# Patient Record
Sex: Female | Born: 1965 | Race: Black or African American | Hispanic: No | Marital: Single | Smoking: Never smoker
Health system: Southern US, Community
[De-identification: ages and names within clinical notes are randomized; demographics above are authoritative.]

## PROBLEM LIST (undated history)

## (undated) DIAGNOSIS — M719 Bursopathy, unspecified: Secondary | ICD-10-CM

## (undated) DIAGNOSIS — F319 Bipolar disorder, unspecified: Secondary | ICD-10-CM

## (undated) DIAGNOSIS — K8020 Calculus of gallbladder without cholecystitis without obstruction: Secondary | ICD-10-CM

## (undated) DIAGNOSIS — F419 Anxiety disorder, unspecified: Secondary | ICD-10-CM

## (undated) DIAGNOSIS — D259 Leiomyoma of uterus, unspecified: Secondary | ICD-10-CM

## (undated) DIAGNOSIS — I10 Essential (primary) hypertension: Secondary | ICD-10-CM

## (undated) DIAGNOSIS — T8859XA Other complications of anesthesia, initial encounter: Secondary | ICD-10-CM

## (undated) DIAGNOSIS — K219 Gastro-esophageal reflux disease without esophagitis: Secondary | ICD-10-CM

## (undated) DIAGNOSIS — E785 Hyperlipidemia, unspecified: Secondary | ICD-10-CM

## (undated) HISTORY — DX: Anxiety disorder, unspecified: F41.9

## (undated) HISTORY — DX: Essential (primary) hypertension: I10

## (undated) HISTORY — PX: ESSURE TUBAL LIGATION: SUR464

## (undated) HISTORY — DX: Calculus of gallbladder without cholecystitis without obstruction: K80.20

## (undated) HISTORY — DX: Bursopathy, unspecified: M71.9

## (undated) HISTORY — DX: Leiomyoma of uterus, unspecified: D25.9

## (undated) HISTORY — DX: Hyperlipidemia, unspecified: E78.5

## (undated) HISTORY — DX: Gastro-esophageal reflux disease without esophagitis: K21.9

## (undated) HISTORY — DX: Bipolar disorder, unspecified: F31.9

---

## 2021-11-03 ENCOUNTER — Emergency Department: Payer: BC Managed Care – PPO

## 2021-11-03 ENCOUNTER — Emergency Department
Admission: EM | Admit: 2021-11-03 | Discharge: 2021-11-03 | Disposition: A | Discharge status: Home / Self Care | Payer: BC Managed Care – PPO | Attending: Emergency Medicine | Admitting: Emergency Medicine

## 2021-11-03 ENCOUNTER — Other Ambulatory Visit: Payer: Self-pay

## 2021-11-03 ENCOUNTER — Encounter: Payer: Self-pay | Admitting: Emergency Medicine

## 2021-11-03 DIAGNOSIS — I10 Essential (primary) hypertension: Secondary | ICD-10-CM | POA: Insufficient documentation

## 2021-11-03 DIAGNOSIS — R079 Chest pain, unspecified: Secondary | ICD-10-CM

## 2021-11-03 DIAGNOSIS — R072 Precordial pain: Secondary | ICD-10-CM | POA: Insufficient documentation

## 2021-11-03 LAB — CBC
HCT: 39 % (ref 36.0–46.0)
Hemoglobin: 12.8 g/dL (ref 12.0–15.0)
MCH: 28.6 pg (ref 26.0–34.0)
MCHC: 32.8 g/dL (ref 30.0–36.0)
MCV: 87.2 fL (ref 80.0–100.0)
Platelets: 291 10*3/uL (ref 150–400)
RBC: 4.47 MIL/uL (ref 3.87–5.11)
RDW: 13.6 % (ref 11.5–15.5)
WBC: 5.5 10*3/uL (ref 4.0–10.5)
nRBC: 0 % (ref 0.0–0.2)

## 2021-11-03 LAB — BASIC METABOLIC PANEL
Anion gap: 6 (ref 5–15)
BUN: 13 mg/dL (ref 6–20)
CO2: 28 mmol/L (ref 22–32)
Calcium: 9.4 mg/dL (ref 8.9–10.3)
Chloride: 106 mmol/L (ref 98–111)
Creatinine, Ser: 0.75 mg/dL (ref 0.44–1.00)
GFR, Estimated: >60 mL/min (ref >60)
Glucose, Bld: 94 mg/dL (ref 70–99)
Potassium: 4 mmol/L (ref 3.5–5.1)
Sodium: 140 mmol/L (ref 135–145)

## 2021-11-03 LAB — TROPONIN I (HIGH SENSITIVITY): Troponin I (High Sensitivity): <2 ng/L (ref <18)

## 2021-11-03 LAB — D-DIMER, QUANTITATIVE: D-Dimer, Quant: <0.27 ug/mL-FEU (ref 0.00–0.50)

## 2021-11-03 MED ORDER — PANTOPRAZOLE SODIUM 40 MG PO TBEC: 40.0000 mg | DELAYED_RELEASE_TABLET | Freq: Every day | ORAL | 0 refills | Status: DC

## 2021-11-03 MED ORDER — PANTOPRAZOLE SODIUM 40 MG PO TBEC
40.0000 mg | DELAYED_RELEASE_TABLET | Freq: Once | ORAL | Status: AC
  Administered 2021-11-03: 40 mg via ORAL
  Filled 2021-11-03: qty 1

## 2021-11-03 MED ORDER — AMLODIPINE BESYLATE 5 MG PO TABS: 5.0000 mg | ORAL_TABLET | Freq: Every day | ORAL | 0 refills | Status: DC

## 2021-11-03 NOTE — ED Triage Notes (Signed)
Pt via POV from [facility]. Pt c/o HTN, intermittent mid-sternal chest pressure, and dizziness for the past couple of weeks. Denies cardiac hx. Denies hx of HTN bu [place] reports a BP of 172/101. Pt is A&Ox4 and NAD.  ?

## 2021-11-03 NOTE — ED Provider Notes (Signed)
? ?[facility] ?Provider Note ? ? ? Event Date/Time  ? First MD Initiated Contact with Patient 11/03/21 1011   ?  (approximate) ? ? ?History  ? ?Chest Pain ? ? ?HPI ? ?Lorraine Perez is a 56 y.o. female with a past medical history of GERD and migraines who presents for evaluation of several concerns primarily some substernal chest pressure associate with intermittent dizziness over the last 3 to 4 weeks.  She states she has also had a couple headaches she feels actually different from usual migraine headaches most recently yesterday but none today.  She states that in the last 24 hours she had a brief episode of right-sided chest pain as well.  This resolved on its own.  She notes she is on estrogen replacement therapy for hot flashes.  No cough, shortness of breath, back pain, earache, sore throat, vision changes, fevers, abdominal pain, vomiting diarrhea or urinary symptoms.  No focal weakness numbness or tingling.  No other acute concerns at this time.  No trauma.  No history of high blood pressure.  He states he only started taking it at home over the last couple of days because she had headache at her place employment couple weeks ago noted be elevated then.  No EtOH use illicit drug use or tobacco use ?  ? ? ?Physical Exam  ?Triage Vital Signs: ?ED Triage Vitals [11/03/21 1005]  ?Enc Vitals Group  ?   BP   ?   Pulse   ?   Resp   ?   Temp   ?   Temp src   ?   SpO2   ?   Weight 160 lb (72.6 kg)  ?   Height 5\' 2"  (1.575 m)  ?   Head Circumference   ?   Peak Flow   ?   Pain Score 5  ?   Pain Loc   ?   Pain Edu?   ?   Excl. in GC?   ? ? ?Most recent vital signs: ?Vitals:  ? 11/03/21 1130 11/03/21 1200  ?BP: (!) 143/76 (!) 142/77  ?Pulse: 74 65  ?Resp: 12 17  ?Temp:    ?SpO2: 100% 100%  ? ? ?General: Awake, no distress.  ?CV:  Good peripheral perfusion.  Plus radial pulse. ?Resp:  Normal effort.  Clear bilaterally.  Right chest is unremarkable. ?Abd:  No distention.  Soft. ?Other:  Cranial  nerves II through XII grossly intact.  No pronator drift.  No finger dysmetria.  Symmetric 5/5 strength of all extremities.  Sensation intact to light touch in all extremities.   ? ? ? ?ED Results / Procedures / Treatments  ?Labs ?(all labs ordered are listed, but only abnormal results are displayed) ?Labs Reviewed  ?BASIC METABOLIC PANEL  ?CBC  ?D-DIMER, QUANTITATIVE  ?TROPONIN I (HIGH SENSITIVITY)  ? ? ? ?EKG ? ?ECG is remarkable for sinus rhythm with a ventricular rate of 68, normal axis, unremarkable intervals without evidence of acute ischemia or significant arrhythmia. ? ? ?RADIOLOGY ?Chest reviewed by myself shows no focal consoidation, effusion, edema, pneumothorax or other clear acute thoracic process. I also reviewed radiology interpretation and agree with findings described. ? ? ? ?PROCEDURES: ? ?Critical Care performed: No ? ?.1-3 Lead EKG Interpretation ?Performed by: 01/03/22, MD ?Authorized by: Gilles Chiquito, MD  ? ?  Interpretation: normal   ?  ECG rate assessment: normal   ?  Rhythm: sinus rhythm   ?  Ectopy:  none   ?  Conduction: normal   ? ?The patient is on the cardiac monitor to evaluate for evidence of arrhythmia and/or significant heart rate changes. ? ? ?MEDICATIONS ORDERED IN ED: ?Medications  ?pantoprazole (PROTONIX) EC tablet 40 mg (has no administration in time range)  ? ? ? ?IMPRESSION / MDM / ASSESSMENT AND PLAN / ED COURSE  ?I reviewed the triage vital signs and the nursing notes. ?             ?               ? ?Differential diagnosis includes, but is not limited to ACS, PE, pneumonia, pneumothorax, GERD, esophageal spasm, arrhythmia, anemia and metabolic derangements with a lower suspicion based description as pressure-like on and off over weeks for or dissection. ? ?Prior to her headache she describes it as a frontal pressure ongoing for several weeks.  Overall description is not suggestive of an SAH.  He has a nonfocal neurological exam that is not suggestive of CVA and  there is no evidence of acute infectious process or trauma at this time.  Possibly related symptomatic hypertension or metabolic derangements versus primary headache. ? ?ECG is remarkable for sinus rhythm with a ventricular rate of 68, normal axis, unremarkable intervals without evidence of acute ischemia or significant arrhythmia.  Given undetectable troponin obtained greater than 3 hours after symptom onset if a very low suspicion for ACS myocarditis. ? ?Chest reviewed by myself shows no focal consoidation, effusion, edema, pneumothorax or other clear acute thoracic process. I also reviewed radiology interpretation and agree with findings described. ? ?Given undetectable D-dimer low suspicion for PE.  CBC without leukocytosis or acute anemia.  BMP without any significant electrolyte or metabolic derangements. ? ?Patient still mildly hypertensive on several reassessments.  We will start on low-dose amlodipine.  Is also certainly possible her substernal pressure is related to GI etiology and will trial a course of Protonix.  Discussed importance of close outpatient PCP follow-up.  Emphasized return for any new or worsening symptoms.  Discharged in stable condition.  Strict return precautions advised and discussed. ? ?  ? ? ?FINAL CLINICAL IMPRESSION(S) / ED DIAGNOSES  ? ?Final diagnoses:  ?Chest pain, unspecified type  ?Hypertension, unspecified type  ? ? ? ?Rx / DC Orders  ? ?ED Discharge Orders   ? ?      Ordered  ?  amLODipine (NORVASC) 5 MG tablet  Daily       ? 11/03/21 1212  ?  pantoprazole (PROTONIX) 40 MG tablet  Daily       ? 11/03/21 1212  ? ?  ?  ? ?  ? ? ? ?Note:  This document was prepared using Dragon voice recognition software and may include unintentional dictation errors. ?  ?Gilles Chiquito, MD ?11/03/21 1213 ? ?

## 2021-12-31 ENCOUNTER — Encounter: Payer: BC Managed Care – PPO | Admitting: Obstetrics and Gynecology

## 2023-01-15 ENCOUNTER — Other Ambulatory Visit: Payer: Self-pay | Admitting: Obstetrics and Gynecology

## 2023-01-15 DIAGNOSIS — Z1231 Encounter for screening mammogram for malignant neoplasm of breast: Secondary | ICD-10-CM

## 2023-11-29 ENCOUNTER — Emergency Department

## 2023-11-29 ENCOUNTER — Emergency Department
Admission: EM | Admit: 2023-11-29 | Discharge: 2023-11-29 | Disposition: A | Discharge status: Home / Self Care | Attending: Emergency Medicine | Admitting: Emergency Medicine

## 2023-11-29 ENCOUNTER — Other Ambulatory Visit: Payer: Self-pay

## 2023-11-29 DIAGNOSIS — K8070 Calculus of gallbladder and bile duct without cholecystitis without obstruction: Secondary | ICD-10-CM | POA: Insufficient documentation

## 2023-11-29 DIAGNOSIS — K8020 Calculus of gallbladder without cholecystitis without obstruction: Secondary | ICD-10-CM

## 2023-11-29 DIAGNOSIS — R109 Unspecified abdominal pain: Secondary | ICD-10-CM | POA: Diagnosis present

## 2023-11-29 DIAGNOSIS — K8050 Calculus of bile duct without cholangitis or cholecystitis without obstruction: Secondary | ICD-10-CM

## 2023-11-29 LAB — CHLAMYDIA/NGC RT PCR (ARMC ONLY)
Chlamydia Tr: NOT DETECTED
N gonorrhoeae: NOT DETECTED

## 2023-11-29 LAB — COMPREHENSIVE METABOLIC PANEL WITH GFR
ALT: 11 U/L (ref 0–44)
AST: 13 U/L — ABNORMAL LOW (ref 15–41)
Albumin: 4.1 g/dL (ref 3.5–5.0)
Alkaline Phosphatase: 53 U/L (ref 38–126)
Anion gap: 10 (ref 5–15)
BUN: 13 mg/dL (ref 6–20)
CO2: 20 mmol/L — ABNORMAL LOW (ref 22–32)
Calcium: 9.2 mg/dL (ref 8.9–10.3)
Chloride: 107 mmol/L (ref 98–111)
Creatinine, Ser: 0.67 mg/dL (ref 0.44–1.00)
GFR, Estimated: >60 mL/min (ref >60)
Glucose, Bld: 97 mg/dL (ref 70–99)
Potassium: 4 mmol/L (ref 3.5–5.1)
Sodium: 137 mmol/L (ref 135–145)
Total Bilirubin: 1.2 mg/dL (ref 0.0–1.2)
Total Protein: 6.7 g/dL (ref 6.5–8.1)

## 2023-11-29 LAB — CBC
HCT: 36.3 % (ref 36.0–46.0)
Hemoglobin: 11.7 g/dL — ABNORMAL LOW (ref 12.0–15.0)
MCH: 28.6 pg (ref 26.0–34.0)
MCHC: 32.2 g/dL (ref 30.0–36.0)
MCV: 88.8 fL (ref 80.0–100.0)
Platelets: 235 10*3/uL (ref 150–400)
RBC: 4.09 MIL/uL (ref 3.87–5.11)
RDW: 14.1 % (ref 11.5–15.5)
WBC: 5.6 10*3/uL (ref 4.0–10.5)
nRBC: 0 % (ref 0.0–0.2)

## 2023-11-29 LAB — WET PREP, GENITAL
Clue Cells Wet Prep HPF POC: NONE SEEN
Sperm: NONE SEEN
Trich, Wet Prep: NONE SEEN
WBC, Wet Prep HPF POC: <10 (ref <10)
Yeast Wet Prep HPF POC: NONE SEEN

## 2023-11-29 LAB — LIPASE, BLOOD: Lipase: 31 U/L (ref 11–51)

## 2023-11-29 MED ORDER — LACTATED RINGERS IV BOLUS
1000.0000 mL | Freq: Once | INTRAVENOUS | Status: AC
  Administered 2023-11-29: 1000 mL via INTRAVENOUS

## 2023-11-29 MED ORDER — OXYCODONE HCL 5 MG PO TABS: 5.0000 mg | ORAL_TABLET | Freq: Three times a day (TID) | ORAL | 0 refills | Status: DC | PRN

## 2023-11-29 MED ORDER — IOHEXOL 300 MG/ML  SOLN
100.0000 mL | Freq: Once | INTRAMUSCULAR | Status: AC | PRN
  Administered 2023-11-29: 100 mL via INTRAVENOUS

## 2023-11-29 MED ORDER — ONDANSETRON 4 MG PO TBDP: 4.0000 mg | ORAL_TABLET | Freq: Three times a day (TID) | ORAL | 0 refills | Status: DC | PRN

## 2023-11-29 MED ORDER — KETOROLAC TROMETHAMINE 30 MG/ML IJ SOLN
15.0000 mg | Freq: Once | INTRAMUSCULAR | Status: AC
  Administered 2023-11-29: 15 mg via INTRAVENOUS
  Filled 2023-11-29: qty 1

## 2023-11-29 NOTE — ED Notes (Signed)
 Warm blanket provided by this RN

## 2023-11-29 NOTE — ED Provider Notes (Signed)
 [facility] Provider Note    Event Date/Time   First MD Initiated Contact with Patient 11/29/23 1051     (approximate)   History   Abdominal Pain   HPI  Lorraine Perez is a 58 y.o. female who presents to the ED for evaluation of Abdominal Pain   I reviewed 5/2 [facility] ED visit, seen for acute abdominal pain, CT with uterine masses suggestive of fibroids, possibly small bowel loops tethered to the anterior uterus.Aaron Aas  CMP with T. bili 1.4, LFTs around 80.  Subsequent 5/5 PCP follow-up.  Patient presents to the ED for evaluation of intermittent abdominal pain over the past few months.  Reports episodic pain that lasts up to an hour at a time, a few times per week, sometimes associated with emesis.   No postprandial symptoms or worsening.  No emesis outside of these episodic pain spells.  No stool or urinary changes.  No fevers, syncope.  She does report for the past few days that she has had different vaginal discharge but no vaginal bleeding, fever   Physical Exam   Triage Vital Signs: ED Triage Vitals  Encounter Vitals Group     BP 11/29/23 1044 124/72     Systolic BP Percentile --      Diastolic BP Percentile --      Pulse Rate 11/29/23 1044 79     Resp 11/29/23 1044 17     Temp 11/29/23 1044 98.7 F (37.1 C)     Temp Source 11/29/23 1044 Oral     SpO2 11/29/23 1044 98 %     Weight 11/29/23 1045 143 lb (64.9 kg)     Height 11/29/23 1045 5\' 2"  (1.575 m)     Head Circumference --      Peak Flow --      Pain Score 11/29/23 1045 7     Pain Loc --      Pain Education --      Exclude from Growth Chart --     Most recent vital signs: Vitals:   11/29/23 1430 11/29/23 1500  BP: (!) 140/75 (!) 134/59  Pulse: 78 61  Resp:    Temp:    SpO2: 96% 100%    General: Awake, no distress.  CV:  Good peripheral perfusion.  Resp:  Normal effort.  Abd:  No distention.  Epigastric mild tenderness, benign lower abdomen.  No peritoneal features or guarding  throughout MSK:  No deformity noted.  Neuro:  No focal deficits appreciated. Other:     ED Results / Procedures / Treatments   Labs (all labs ordered are listed, but only abnormal results are displayed) Labs Reviewed  COMPREHENSIVE METABOLIC PANEL WITH GFR - Abnormal; Notable for the following components:      Result Value   CO2 20 (*)    AST 13 (*)    All other components within normal limits  CBC - Abnormal; Notable for the following components:   Hemoglobin 11.7 (*)    All other components within normal limits  CHLAMYDIA/NGC RT PCR ([facility] ONLY)            WET PREP, GENITAL  LIPASE, BLOOD  URINALYSIS, ROUTINE W REFLEX MICROSCOPIC  POC URINE PREG, ED    EKG   RADIOLOGY RUQ ultrasound interpreted by me with gallstones without clear signs of acute cholecystitis CT abdomen/pelvis interpreted by me with slightly enlarged gallbladder without clear stones  Official radiology report(s): US  ABDOMEN LIMITED RUQ (LIVER/GB) Result Date: 11/29/2023 CLINICAL  DATA:  [id number] Emesis [id number] Abdominal pain [id number] EXAM: ULTRASOUND ABDOMEN LIMITED RIGHT UPPER QUADRANT COMPARISON:  Same day CT. FINDINGS: Gallbladder: Cholelithiasis. Mild wall prominence measuring up to approximately 3 mm. No sonographic Murphy sign noted by sonographer. Common bile duct: Diameter: Visualized portion measures 8 mm, dilated. Liver: No focal lesion identified. Mildly heterogeneous in parenchymal echogenicity. Portal vein is patent on color Doppler imaging with normal direction of blood flow towards the liver. Other: None. IMPRESSION: 1. Cholelithiasis with mild gallbladder wall prominence. No sonographic Murphy sign noted by sonographer. Findings are equivocal for acute cholecystitis and may reflect chronic cholecystitis. If persistent clinical concern, recommend further evaluation with HIDA scan (with ejection fraction calculation if concern for chronic biliary dyskinesia). 2. Common bile duct is dilated measuring up  to 8 mm. Recommend correlation with LFTs. If there is concern for biliary obstruction, recommend further evaluation with MRCP. Electronically Signed   By: Clancy Crimes M.D.   On: 11/29/2023 14:08   CT ABDOMEN PELVIS W CONTRAST Result Date: 11/29/2023 CLINICAL DATA:  intermittent abdominal pain, emesis. acute upper abd pain today, mild TTP EXAM: CT ABDOMEN AND PELVIS WITH CONTRAST TECHNIQUE: Multidetector CT imaging of the abdomen and pelvis was performed using the standard protocol following bolus administration of intravenous contrast. RADIATION DOSE REDUCTION: This exam was performed according to the departmental dose-optimization program which includes automated exposure control, adjustment of the mA and/or kV according to patient size and/or use of iterative reconstruction technique. CONTRAST:  100mL OMNIPAQUE IOHEXOL 300 MG/ML  SOLN COMPARISON:  None Available. FINDINGS: Lower chest: Bibasilar atelectasis. Hepatobiliary: There is mild circumferential gallbladder wall thickening without adjacent fat stranding. Common bile duct is mildly enlarged and measures approximately 8 mm. No choledocholithiasis or visualized. Mild focal fatty deposition along the falciform ligament. Pancreas: Unremarkable. No pancreatic ductal dilatation or surrounding inflammatory changes. Spleen: Normal in size without focal abnormality. Adrenals/Urinary Tract: Adrenal glands are unremarkable. Kidneys enhance symmetrically. No hydronephrosis. No obstructing nephrolithiasis. Bladder is unremarkable. Stomach/Bowel: No evidence of bowel obstruction. Appendix is normal. Stomach is decompressed, limiting evaluation. Vascular/Lymphatic: No significant vascular findings are present. No enlarged abdominal or pelvic lymph nodes. Reproductive: Innumerable enhancing masses of the uterus which distort the endometrial canal. Some are exophytic in appearance and some are peripherally calcified. These are most consistent with innumerable  fibroids. Prominent submucosal enhancement of the vagina with wall prominence. Other: No free air or free fluid. Musculoskeletal: No acute or significant osseous findings. IMPRESSION: 1. There is mild circumferential gallbladder wall thickening without adjacent fat stranding. This is nonspecific it could be seen in the setting of chronic cholecystitis. If concern for biliary dyskinesia, this would be better assessed with dedicated HIDA scan with ejection fraction calculation. 2. Common bile duct is mildly enlarged measuring approximately 8 mm. No choledocholithiasis or visualized. Recommend correlation with LFTs. If there is concern for biliary obstruction, recommend further evaluation with MRCP. 3. Fibroid uterus. 4. Prominent submucosal enhancement of the vagina with wall prominence. This is nonspecific and could reflect vaginitis versus sequela of pelvic congestion. Recommend correlation with physical exam and history. Electronically Signed   By: Clancy Crimes M.D.   On: 11/29/2023 12:36    PROCEDURES and INTERVENTIONS:  Procedures  Medications  lactated ringers bolus 1,000 mL (1,000 mLs Intravenous New Bag/Given 11/29/23 1139)  ketorolac (TORADOL) 30 MG/ML injection 15 mg (15 mg Intravenous Given 11/29/23 1139)  iohexol (OMNIPAQUE) 300 MG/ML solution 100 mL (100 mLs Intravenous Contrast Given 11/29/23 1152)     IMPRESSION /  MDM / ASSESSMENT AND PLAN / ED COURSE  I reviewed the triage vital signs and the nursing notes.  Differential diagnosis includes, but is not limited to, SBO, IBS, functional pain, uterine fibroids, PID  {Patient presents with symptoms of an acute illness or injury that is potentially life-threatening.  58 year old woman presents to the ED with signs of symptomatic with area colic suitable for outpatient management with surgical follow-up.  Pain resolved with Toradol, minimal localized tenderness without guarding or peritoneal features.  Normal LFTs without signs of  hepatobiliary obstruction.  Normal white count, lipase.  Wet prep without abnormal features considering her mild complaints of abnormal vaginal discharge.  No signs of choledocholithiasis or cholecystitis clinically.  I do believe she is suitable for outpatient management.  Clinical Course as of 11/29/23 1535  Sat Nov 29, 2023  1256 Reassessed and reexamined.  Discussed workup so far and how compared to the workup at [facility] few weeks ago.  Discussed CT results and my recommendation for an RUQ ultrasound.  She is agreeable.  Reports feeling fine right now [DS]    Clinical Course User Index [DS] Arline Bennett, MD     FINAL CLINICAL IMPRESSION(S) / ED DIAGNOSES   Final diagnoses:  Biliary colic  Calculus of gallbladder without cholecystitis without obstruction     Rx / DC Orders   ED Discharge Orders          Ordered    oxyCODONE (ROXICODONE) 5 MG immediate release tablet  Every 8 hours PRN        11/29/23 1534    ondansetron (ZOFRAN-ODT) 4 MG disintegrating tablet  Every 8 hours PRN        11/29/23 1534             Note:  This document was prepared using Dragon voice recognition software and may include unintentional dictation errors.   Arline Bennett, MD 11/29/23 1537

## 2023-11-29 NOTE — ED Triage Notes (Signed)
 Pt comes in via pov with complains of abdominal pain. Pt states that she was seen at [facility] 2 weeks ago, and she was told that her small bowel is attached to her uterus. Pt was supposed to follow up with her primary care, who is not available at this time. Pt was prescribed oxycodone for pain, but pt hasn't had any relief. Pt took some tylenol about 2 hours ago, and now rates her pain 7/10. Pt is alert and oriented x4 with no signs of acute distress at this time.

## 2023-11-29 NOTE — Discharge Instructions (Signed)
 As we discussed, signs of gallstones likely causing your symptoms.  Reach out to the surgeon to be seen in the clinic to discuss removal of your gallbladder and next steps  In the meantime, use the medications to help with nausea and pain.  Please take Tylenol and ibuprofen/Advil for your pain.  It is safe to take them together, or to alternate them every few hours.  Take up to 1000mg  of Tylenol at a time, up to 4 times per day.  Do not take more than 4000 mg of Tylenol in 24 hours.  For ibuprofen, take 400-600 mg, 3 - 4 times per day.

## 2023-12-15 ENCOUNTER — Ambulatory Visit: Payer: Self-pay | Admitting: Surgery

## 2023-12-15 ENCOUNTER — Encounter: Payer: Self-pay | Admitting: Surgery

## 2023-12-15 VITALS — BP 149/93 | HR 60 | Temp 98.3°F | Ht 62.0 in | Wt 148.0 lb

## 2023-12-15 DIAGNOSIS — K8020 Calculus of gallbladder without cholecystitis without obstruction: Secondary | ICD-10-CM | POA: Diagnosis not present

## 2023-12-15 NOTE — H&P (View-Only) (Signed)
 12/15/2023  Reason for Visit: Symptomatic cholelithiasis  History of Present Illness: Ylianna Osterman is a 58 y.o. female presenting for evaluation of symptomatic cholelithiasis.  The patient reports having intermittent episodes of epigastric and RUQ pain over the past two months.  She has had a handful of episodes thus far.  She went to [facility] on 10/31/23 and had a CT scan of abdomen which did not show any acute findings.  She also went to the ED at [facility] on 11/29/23 and had both CT scan and U/S, the latter which showed cholelithiasis and borderline wall thickness of 3 mm and CBD of 8 mm.  She reports that with the pain episodes she's had severe nausea and vomiting, and has had residual epigastric soreness after the episodes.  Denies any fevers and chills.  She reports the episodes lasted a couple of hours each.  All of them happened at night time.    Past Medical History: Past Medical History:  Diagnosis Date   Anxiety    Bipolar disorder (HCC)    Bursitis    Cholelithiasis    Fibroid uterus    GERD (gastroesophageal reflux disease)    Hyperlipidemia    Hypertension      Past Surgical History: Past Surgical History:  Procedure Laterality Date   ESSURE TUBAL LIGATION      Home Medications: Prior to Admission medications   Medication Sig Start Date End Date Taking? Authorizing Provider  amLODipine  (NORVASC ) 5 MG tablet Take 1 tablet (5 mg total) by mouth daily. 11/03/21 12/15/23 Yes Renie Carver, MD  CLIMARA PRO 0.045-0.015 MG/DAY 1 patch once a week.   Yes [provider]  FLUoxetine (PROZAC) 10 MG capsule Take 10 mg by mouth daily.   Yes [provider]  Galcanezumab-gnlm (EMGALITY) 120 MG/ML SOAJ Inject the contents of 1 pen (120 mg) into the skin every 30 days. 10/02/20  Yes [provider]  lamoTRIgine (LAMICTAL) 200 MG tablet Take 200 mg by mouth daily.   Yes [provider]  lithium carbonate (LITHOBID) 300 MG ER tablet Take 300 mg  by mouth 2 (two) times daily.   Yes [provider]  ondansetron  (ZOFRAN -ODT) 4 MG disintegrating tablet Take 1 tablet (4 mg total) by mouth every 8 (eight) hours as needed. 11/29/23  Yes Arline Bennett, MD  oxyCODONE  (ROXICODONE ) 5 MG immediate release tablet Take 1 tablet (5 mg total) by mouth every 8 (eight) hours as needed. 11/29/23 11/28/24 Yes Arline Bennett, MD  pantoprazole  (PROTONIX ) 40 MG tablet Take 1 tablet (40 mg total) by mouth daily for 14 days. 11/03/21 12/15/23 Yes Renie Carver, MD  TRAZODONE HCL ER PO Take 75 mg by mouth.   Yes [provider]    Allergies: No Known Allergies  Social History:  reports that she has never smoked. She has never used smokeless tobacco. No history on file for alcohol use and drug use.   Family History: History reviewed. No pertinent family history.  Review of Systems: Review of Systems  Constitutional:  Negative for chills and fever.  HENT:  Negative for hearing loss.   Respiratory:  Negative for shortness of breath.   Cardiovascular:  Negative for chest pain.  Gastrointestinal:  Positive for abdominal pain, nausea and vomiting.  Genitourinary:  Negative for dysuria.  Musculoskeletal:  Positive for back pain. Negative for myalgias.  Skin:  Negative for rash.  Neurological:  Negative for dizziness.  Psychiatric/Behavioral:  Negative for depression.     Physical Exam  BP (!) 149/93   Pulse 60   Temp 98.3 F (36.8 C) (Oral)   Ht 5' 2 (1.575 m)   Wt 148 lb (67.1 kg)   SpO2 100%   BMI 27.07 kg/m  CONSTITUTIONAL: No acute distress HEENT:  Normocephalic, atraumatic, extraocular motion intact. NECK: Trachea is midline, and there is no jugular venous distension.  RESPIRATORY:  Lungs are clear, and breath sounds are equal bilaterally. Normal respiratory effort without pathologic use of accessory muscles. CARDIOVASCULAR: Heart is regular without murmurs, gallops, or rubs. GI: The abdomen is soft, non-distended, with  discomfort in epigastric and right upper quadrant areas.  Negative Murphy's sign.  MUSCULOSKELETAL:  Normal muscle strength and tone in all four extremities.  No peripheral edema or cyanosis. SKIN: Skin turgor is normal. There are no pathologic skin lesions.  NEUROLOGIC:  Motor and sensation is grossly normal.  Cranial nerves are grossly intact. PSYCH:  Alert and oriented to person, place and time. Affect is normal.  Laboratory Analysis: Labs from 11/29/23: Na 137, K 4, Cl 107, CO2 20, BUN 13, Cr 0.67.  Total bili 1.2, AST 13, ALT 11, Alk Phos 53, albumin 4.1, lipase 31.  WBC 5.6, Hgb 11.7, Hct 36.3, Plt 235.  Imaging: CT abdomen/pelvis on 11/29/23: IMPRESSION: 1. There is mild circumferential gallbladder wall thickening without adjacent fat stranding. This is nonspecific it could be seen in the setting of chronic cholecystitis. If concern for biliary dyskinesia, this would be better assessed with dedicated HIDA scan with ejection fraction calculation. 2. Common bile duct is mildly enlarged measuring approximately 8 mm. No choledocholithiasis or visualized. Recommend correlation with LFTs. If there is concern for biliary obstruction, recommend further evaluation with MRCP. 3. Fibroid uterus. 4. Prominent submucosal enhancement of the vagina with wall prominence. This is nonspecific and could reflect vaginitis versus sequela of pelvic congestion. Recommend correlation with physical exam and history.  Ultrasound RUQ on 11/29/23: IMPRESSION: 1. Cholelithiasis with mild gallbladder wall prominence. No sonographic Murphy sign noted by sonographer. Findings are equivocal for acute cholecystitis and may reflect chronic cholecystitis. If persistent clinical concern, recommend further evaluation with HIDA scan (with ejection fraction calculation if concern for chronic biliary dyskinesia). 2. Common bile duct is dilated measuring up to 8 mm. Recommend correlation with LFTs. If there is concern for biliary  obstruction, recommend further evaluation with MRCP.  Assessment and Plan: This is a 58 y.o. female with symptomatic cholelithiasis  --Discussed with the patient the findings on her imaging studies.  On her CT scan at [facility], no cholelithiasis was identified, but I discussed with the patient that gallstones are not usually seen on CT scan and ultrasound is the better study to evaluate the gallbladder.  On her ED visit at [facility], there was some mild wall thickening seen on her CT scan and ultrasound, and her ultrasound also showed cholelithiasis.  Given the location of her pain, and associated symptoms, I think her gallbladder is the likely etiology.  There is no evidence of any bowel dilation or obstruction.  There is stool burden in her colon and she does have constipation issues.  I recommended that she start taking MiraLax.   --From the gallbladder standpoint, discussed options for conservative management with low fat diet vs surgical management with cholecystectomy.  She has opted for surgery.  Reviewed then with her the plan for a robotic assisted cholecystectomy and discussed with her the surgery at length including the planned incisions, the risks of bleeding, infection, injury to surrounding structures, the use  of ICG to better evaluate the biliary anatomy, that this would be an outpatient surgery, post-operative activity restrictions, pain control, and she's willing to proceed. --Will schedule her for surgery on 12/25/23.  All of her questions have been answered.  I spent 60 minutes dedicated to the care of this patient on the date of this encounter to include pre-visit review of records, face-to-face time with the patient discussing diagnosis and management, and any post-visit coordination of care.   Marene Shape, MD [facility]

## 2023-12-15 NOTE — Patient Instructions (Signed)
 You have requested to have your gallbladder removed. This will be done at [facility] with Dr. Aleen Campi.  If you are on any injectable weight loss medication, you will need to stop taking your GLP-1 injectable (weight loss) medications 8 days before your surgery to avoid any complications with anesthesia.   You will most likely be out of work 1-2 weeks for this surgery.  If you have FMLA or disability paperwork that needs filled out you may drop this off at our office or this can be faxed to [phone].  You will return after your post-op appointment with a lifting restriction for approximately 4 more weeks.  You will be able to eat anything you would like to following surgery. But, start by eating a bland diet and advance this as tolerated. The Gallbladder diet is below, please go as closely by this diet as possible prior to surgery to avoid any further attacks.  Please see the (blue)pre-care form that you have been given today. Our surgery scheduler will call you to verify surgery date and to go over information.   If you have any questions, please call our office.  Laparoscopic Cholecystectomy Laparoscopic cholecystectomy is surgery to remove the gallbladder. The gallbladder is located in the upper right part of the abdomen, behind the liver. It is a storage sac for bile, which is produced in the liver. Bile aids in the digestion and absorption of fats. Cholecystectomy is often done for inflammation of the gallbladder (cholecystitis). This condition is usually caused by a buildup of gallstones (cholelithiasis) in the gallbladder. Gallstones can block the flow of bile, and that can result in inflammation and pain. In severe cases, emergency surgery may be required. If emergency surgery is not required, you will have time to prepare for the procedure. Laparoscopic surgery is an alternative to open surgery. Laparoscopic surgery has a shorter recovery time. Your common bile duct may also  need to be examined during the procedure. If stones are found in the common bile duct, they may be removed. LET [facility] CARE PROVIDER KNOW ABOUT: Any allergies you have. All medicines you are taking, including vitamins, herbs, eye drops, creams, and over-the-counter medicines. Previous problems you or members of your family have had with the use of anesthetics. Any blood disorders you have. Previous surgeries you have had.  Any medical conditions you have. RISKS AND COMPLICATIONS Generally, this is a safe procedure. However, problems may occur, including: Infection. Bleeding. Allergic reactions to medicines. Damage to other structures or organs. A stone remaining in the common bile duct. A bile leak from the cyst duct that is clipped when your gallbladder is removed. The need to convert to open surgery, which requires a larger incision in the abdomen. This may be necessary if your surgeon thinks that it is not safe to continue with a laparoscopic procedure. BEFORE THE PROCEDURE Ask your health care provider about: Changing or stopping your regular medicines. This is especially important if you are taking diabetes medicines or blood thinners. Taking medicines such as aspirin and ibuprofen. These medicines can thin your blood. Do not take these medicines before your procedure if your health care provider instructs you not to. Follow instructions from your health care provider about eating or drinking restrictions. Let your health care provider know if you develop a cold or an infection before surgery. Plan to have someone take you home after the procedure. Ask your health care provider how your surgical site will be marked or identified. You may  be given antibiotic medicine to help prevent infection. PROCEDURE To reduce your risk of infection: Your health care team will wash or sanitize their hands. Your skin will be washed with soap. An IV tube may be inserted into one of your  veins. You will be given a medicine to make you fall asleep (general anesthetic). A breathing tube will be placed in your mouth. The surgeon will make several small cuts (incisions) in your abdomen. A thin, lighted tube (laparoscope) that has a tiny camera on the end will be inserted through one of the small incisions. The camera on the laparoscope will send a picture to a TV screen (monitor) in the operating room. This will give the surgeon a good view inside your abdomen. A gas will be pumped into your abdomen. This will expand your abdomen to give the surgeon more room to perform the surgery. Other tools that are needed for the procedure will be inserted through the other incisions. The gallbladder will be removed through one of the incisions. After your gallbladder has been removed, the incisions will be closed with stitches (sutures), staples, or skin glue. Your incisions may be covered with a bandage (dressing). The procedure may vary among health care providers and hospitals. AFTER THE PROCEDURE Your blood pressure, heart rate, breathing rate, and blood oxygen level will be monitored often until the medicines you were given have worn off. You will be given medicines as needed to control your pain.   This information is not intended to replace advice given to you by your health care provider. Make sure you discuss any questions you have with your health care provider.   Document Released: 06/17/2005 Document Revised: 03/08/2015 Document Reviewed: 01/27/2013 Elsevier Interactive Patient Education 2016 [employer] Inc[employer]   Low-Fat Diet for Gallbladder Conditions A low-fat diet can be helpful if you have pancreatitis or a gallbladder condition. With these conditions, your pancreas and gallbladder have trouble digesting fats. A healthy eating plan with less fat will help rest your pancreas and gallbladder and reduce your symptoms. WHAT DO I NEED TO KNOW ABOUT THIS DIET? Eat a low-fat  diet. Reduce your fat intake to less than 20-30% of your total daily calories. This is less than 50-60 g of fat per day. Remember that you need some fat in your diet. Ask your dietician what your daily goal should be. Choose nonfat and low-fat healthy foods. Look for the words "nonfat," "low fat," or "fat free." As a guide, look on the label and choose foods with less than 3 g of fat per serving. Eat only one serving. Avoid alcohol. Do not smoke. If you need help quitting, talk with your health care provider. Eat small frequent meals instead of three large heavy meals. WHAT FOODS CAN I EAT? Grains Include healthy grains and starches such as potatoes, wheat bread, fiber-rich cereal, and brown rice. Choose whole grain options whenever possible. In adults, whole grains should account for 45-65% of your daily calories.  Fruits and Vegetables Eat plenty of fruits and vegetables. Fresh fruits and vegetables add fiber to your diet. Meats and Other Protein Sources Eat lean meat such as chicken and pork. Trim any fat off of meat before cooking it. Eggs, fish, and beans are other sources of protein. In adults, these foods should account for 10-35% of your daily calories. Dairy Choose low-fat milk and dairy options. Dairy includes fat and protein, as well as calcium.  Fats and Oils Limit high-fat foods such as fried foods, sweets, baked  goods, sugary drinks.  Other Creamy sauces and condiments, such as mayonnaise, can add extra fat. Think about whether or not you need to use them, or use smaller amounts or low fat options. WHAT FOODS ARE NOT RECOMMENDED? High fat foods, such as: [employer]. Ice cream. [place] toast. Sweet rolls. Pizza. Cheese bread. Foods covered with batter, butter, creamy sauces, or cheese. Fried foods. Sugary drinks and desserts. Foods that cause gas or bloating   This information is not intended to replace advice given to you by your health care provider. Make sure you  discuss any questions you have with your health care provider.   Document Released: 06/22/2013 Document Reviewed: 06/22/2013 Elsevier Interactive Patient Education [employer]

## 2023-12-15 NOTE — Progress Notes (Signed)
 12/15/2023  Reason for Visit: Symptomatic cholelithiasis  History of Present Illness: Lorraine Perez is a 58 y.o. female presenting for evaluation of symptomatic cholelithiasis.  The patient reports having intermittent episodes of epigastric and RUQ pain over the past two months.  She has had a handful of episodes thus far.  She went to [facility] on 10/31/23 and had a CT scan of abdomen which did not show any acute findings.  She also went to the ED at [facility] on 11/29/23 and had both CT scan and U/S, the latter which showed cholelithiasis and borderline wall thickness of 3 mm and CBD of 8 mm.  She reports that with the pain episodes she's had severe nausea and vomiting, and has had residual epigastric soreness after the episodes.  Denies any fevers and chills.  She reports the episodes lasted a couple of hours each.  All of them happened at night time.    Past Medical History: Past Medical History:  Diagnosis Date   Anxiety    Bipolar disorder (HCC)    Bursitis    Cholelithiasis    Fibroid uterus    GERD (gastroesophageal reflux disease)    Hyperlipidemia    Hypertension      Past Surgical History: Past Surgical History:  Procedure Laterality Date   ESSURE TUBAL LIGATION      Home Medications: Prior to Admission medications   Medication Sig Start Date End Date Taking? Authorizing Provider  amLODipine  (NORVASC ) 5 MG tablet Take 1 tablet (5 mg total) by mouth daily. 11/03/21 12/15/23 Yes Renie Carver, MD  CLIMARA PRO 0.045-0.015 MG/DAY 1 patch once a week.   Yes [provider]  FLUoxetine (PROZAC) 10 MG capsule Take 10 mg by mouth daily.   Yes [provider]  Galcanezumab-gnlm (EMGALITY) 120 MG/ML SOAJ Inject the contents of 1 pen (120 mg) into the skin every 30 days. 10/02/20  Yes [provider]  lamoTRIgine (LAMICTAL) 200 MG tablet Take 200 mg by mouth daily.   Yes [provider]  lithium carbonate (LITHOBID) 300 MG ER tablet Take 300 mg  by mouth 2 (two) times daily.   Yes [provider]  ondansetron  (ZOFRAN -ODT) 4 MG disintegrating tablet Take 1 tablet (4 mg total) by mouth every 8 (eight) hours as needed. 11/29/23  Yes Arline Bennett, MD  oxyCODONE  (ROXICODONE ) 5 MG immediate release tablet Take 1 tablet (5 mg total) by mouth every 8 (eight) hours as needed. 11/29/23 11/28/24 Yes Arline Bennett, MD  pantoprazole  (PROTONIX ) 40 MG tablet Take 1 tablet (40 mg total) by mouth daily for 14 days. 11/03/21 12/15/23 Yes Renie Carver, MD  TRAZODONE HCL ER PO Take 75 mg by mouth.   Yes [provider]    Allergies: No Known Allergies  Social History:  reports that she has never smoked. She has never used smokeless tobacco. No history on file for alcohol use and drug use.   Family History: History reviewed. No pertinent family history.  Review of Systems: Review of Systems  Constitutional:  Negative for chills and fever.  HENT:  Negative for hearing loss.   Respiratory:  Negative for shortness of breath.   Cardiovascular:  Negative for chest pain.  Gastrointestinal:  Positive for abdominal pain, nausea and vomiting.  Genitourinary:  Negative for dysuria.  Musculoskeletal:  Positive for back pain. Negative for myalgias.  Skin:  Negative for rash.  Neurological:  Negative for dizziness.  Psychiatric/Behavioral:  Negative for depression.     Physical Exam  BP (!) 149/93   Pulse 60   Temp 98.3 F (36.8 C) (Oral)   Ht 5' 2 (1.575 m)   Wt 148 lb (67.1 kg)   SpO2 100%   BMI 27.07 kg/m  CONSTITUTIONAL: No acute distress HEENT:  Normocephalic, atraumatic, extraocular motion intact. NECK: Trachea is midline, and there is no jugular venous distension.  RESPIRATORY:  Lungs are clear, and breath sounds are equal bilaterally. Normal respiratory effort without pathologic use of accessory muscles. CARDIOVASCULAR: Heart is regular without murmurs, gallops, or rubs. GI: The abdomen is soft, non-distended, with  discomfort in epigastric and right upper quadrant areas.  Negative Murphy's sign.  MUSCULOSKELETAL:  Normal muscle strength and tone in all four extremities.  No peripheral edema or cyanosis. SKIN: Skin turgor is normal. There are no pathologic skin lesions.  NEUROLOGIC:  Motor and sensation is grossly normal.  Cranial nerves are grossly intact. PSYCH:  Alert and oriented to person, place and time. Affect is normal.  Laboratory Analysis: Labs from 11/29/23: Na 137, K 4, Cl 107, CO2 20, BUN 13, Cr 0.67.  Total bili 1.2, AST 13, ALT 11, Alk Phos 53, albumin 4.1, lipase 31.  WBC 5.6, Hgb 11.7, Hct 36.3, Plt 235.  Imaging: CT abdomen/pelvis on 11/29/23: IMPRESSION: 1. There is mild circumferential gallbladder wall thickening without adjacent fat stranding. This is nonspecific it could be seen in the setting of chronic cholecystitis. If concern for biliary dyskinesia, this would be better assessed with dedicated HIDA scan with ejection fraction calculation. 2. Common bile duct is mildly enlarged measuring approximately 8 mm. No choledocholithiasis or visualized. Recommend correlation with LFTs. If there is concern for biliary obstruction, recommend further evaluation with MRCP. 3. Fibroid uterus. 4. Prominent submucosal enhancement of the vagina with wall prominence. This is nonspecific and could reflect vaginitis versus sequela of pelvic congestion. Recommend correlation with physical exam and history.  Ultrasound RUQ on 11/29/23: IMPRESSION: 1. Cholelithiasis with mild gallbladder wall prominence. No sonographic Murphy sign noted by sonographer. Findings are equivocal for acute cholecystitis and may reflect chronic cholecystitis. If persistent clinical concern, recommend further evaluation with HIDA scan (with ejection fraction calculation if concern for chronic biliary dyskinesia). 2. Common bile duct is dilated measuring up to 8 mm. Recommend correlation with LFTs. If there is concern for biliary  obstruction, recommend further evaluation with MRCP.  Assessment and Plan: This is a 58 y.o. female with symptomatic cholelithiasis  --Discussed with the patient the findings on her imaging studies.  On her CT scan at [facility], no cholelithiasis was identified, but I discussed with the patient that gallstones are not usually seen on CT scan and ultrasound is the better study to evaluate the gallbladder.  On her ED visit at [facility], there was some mild wall thickening seen on her CT scan and ultrasound, and her ultrasound also showed cholelithiasis.  Given the location of her pain, and associated symptoms, I think her gallbladder is the likely etiology.  There is no evidence of any bowel dilation or obstruction.  There is stool burden in her colon and she does have constipation issues.  I recommended that she start taking MiraLax.   --From the gallbladder standpoint, discussed options for conservative management with low fat diet vs surgical management with cholecystectomy.  She has opted for surgery.  Reviewed then with her the plan for a robotic assisted cholecystectomy and discussed with her the surgery at length including the planned incisions, the risks of bleeding, infection, injury to surrounding structures, the use  of ICG to better evaluate the biliary anatomy, that this would be an outpatient surgery, post-operative activity restrictions, pain control, and she's willing to proceed. --Will schedule her for surgery on 12/25/23.  All of her questions have been answered.  I spent 60 minutes dedicated to the care of this patient on the date of this encounter to include pre-visit review of records, face-to-face time with the patient discussing diagnosis and management, and any post-visit coordination of care.   Marene Shape, MD [facility]

## 2023-12-16 ENCOUNTER — Telehealth: Payer: Self-pay | Admitting: Surgery

## 2023-12-16 NOTE — Telephone Encounter (Signed)
 Patient has been advised of Pre-Admission date/time, and Surgery date at [facility].  Surgery Date: 12/25/23 Preadmission Testing Date: 12/18/23 (phone 8a-1p)  Patient informed of the scheduling process and surgery information given at time of office visit.   Patient has been made aware to call [phone], between 1-3:00pm the day before surgery, to find out what time to arrive for surgery.

## 2023-12-18 ENCOUNTER — Encounter
Admission: RE | Admit: 2023-12-18 | Discharge: 2023-12-18 | Disposition: A | Discharge status: Home / Self Care | Source: Ambulatory Visit | Attending: Surgery | Admitting: Surgery

## 2023-12-18 ENCOUNTER — Encounter: Payer: Self-pay | Admitting: *Deleted

## 2023-12-18 ENCOUNTER — Other Ambulatory Visit: Payer: Self-pay

## 2023-12-18 HISTORY — DX: Other complications of anesthesia, initial encounter: T88.59XA

## 2023-12-18 NOTE — Patient Instructions (Signed)
 Your procedure is scheduled on:12-25-23 Thursday Report to the Registration Desk on the 1st floor of the Medical Mall.Then proceed to the 2nd floor Surgery Desk To find out your arrival time, please call [phone] between 1PM - 3PM on:12-24-23 Wednesday If your arrival time is 6:00 am, do not arrive before that time as the Medical Mall entrance doors do not open until 6:00 am.  REMEMBER: Instructions that are not followed completely may result in serious medical risk, up to and including death; or upon the discretion of your surgeon and anesthesiologist your surgery may need to be rescheduled.  Do not eat food after midnight the night before surgery.  No gum chewing or hard candies.  You may however, drink CLEAR liquids up to 2 hours before you are scheduled to arrive for your surgery. Do not drink anything within 2 hours of your scheduled arrival time.  Clear liquids include: - water  - apple juice without pulp - gatorade (not RED colors) - black coffee or tea (Do NOT add milk or creamers to the coffee or tea) Do NOT drink anything that is not on this list.  One week prior to surgery:Stop NOW (12-18-23) Stop Anti-inflammatories (NSAIDS) such as Advil, Aleve, Ibuprofen, Motrin, Naproxen, Naprosyn and Aspirin based products such as Excedrin, Goody's Powder, BC Powder. Stop ANY OVER THE COUNTER supplements until after surgery.  You may however, continue to take Tylenol/Oxycodone  if needed for pain up until the day of surgery.  Continue taking all of your other prescription medications up until the day of surgery.  ON THE DAY OF SURGERY ONLY TAKE THESE MEDICATIONS WITH SIPS OF WATER: -you may take oxyCODONE  (ROXICODONE ) if needed for pain  No Alcohol for 24 hours before or after surgery.  No Smoking including e-cigarettes for 24 hours before surgery.  No chewable tobacco products for at least 6 hours before surgery.  No nicotine patches on the day of surgery.  Do not use any  recreational drugs for at least a week (preferably 2 weeks) before your surgery.  Please be advised that the combination of cocaine and anesthesia may have negative outcomes, up to and including death. If you test positive for cocaine, your surgery will be cancelled.  On the morning of surgery brush your teeth with toothpaste and water, you may rinse your mouth with mouthwash if you wish. Do not swallow any toothpaste or mouthwash.  Use CHG Soap as directed on instruction sheet.  Do not wear jewelry, make-up, hairpins, clips or nail polish.  For welded (permanent) jewelry: bracelets, anklets, waist bands, etc.  Please have this removed prior to surgery.  If it is not removed, there is a chance that hospital personnel will need to cut it off on the day of surgery.  Do not wear lotions, powders, or perfumes.   Do not shave body hair from the neck down 48 hours before surgery.  Contact lenses, hearing aids and dentures may not be worn into surgery.  Do not bring valuables to the hospital. [facility] is not responsible for any missing/lost belongings or valuables.   Notify your doctor if there is any change in your medical condition (cold, fever, infection).  Wear comfortable clothing (specific to your surgery type) to the hospital.  After surgery, you can help prevent lung complications by doing breathing exercises.  Take deep breaths and cough every 1-2 hours. Your doctor may order a device called an Incentive Spirometer to help you take deep breaths. When coughing or sneezing, hold  a pillow firmly against your incision with both hands. This is called "splinting." Doing this helps protect your incision. It also decreases belly discomfort.  If you are being admitted to the hospital overnight, leave your suitcase in the car. After surgery it may be brought to your room.  In case of increased patient census, it may be necessary for you, the patient, to continue your postoperative care in  the Same Day Surgery department.  If you are being discharged the day of surgery, you will not be allowed to drive home. You will need a responsible individual to drive you home and stay with you for 24 hours after surgery.   If you are taking public transportation, you will need to have a responsible individual with you.  Please call the Pre-admissions Testing Dept. at [phone] if you have any questions about these instructions.  Surgery Visitation Policy:  Patients having surgery or a procedure may have two visitors.  Children under the age of 34 must have an adult with them who is not the patient.     Preparing for Surgery with CHLORHEXIDINE GLUCONATE (CHG) Soap  Chlorhexidine Gluconate (CHG) Soap  o An antiseptic cleaner that kills germs and bonds with the skin to continue killing germs even after washing  o Used for showering the night before surgery and morning of surgery  Before surgery, you can play an important role by reducing the number of germs on your skin.  CHG (Chlorhexidine gluconate) soap is an antiseptic cleanser which kills germs and bonds with the skin to continue killing germs even after washing.  Please do not use if you have an allergy to CHG or antibacterial soaps. If your skin becomes reddened/irritated stop using the CHG.  1. Shower the NIGHT BEFORE SURGERY and the MORNING OF SURGERY with CHG soap.  2. If you choose to wash your hair, wash your hair first as usual with your normal shampoo.  3. After shampooing, rinse your hair and body thoroughly to remove the shampoo.  4. Use CHG as you would any other liquid soap. You can apply CHG directly to the skin and wash gently with a scrungie or a clean washcloth.  5. Apply the CHG soap to your body only from the neck down. Do not use on open wounds or open sores. Avoid contact with your eyes, ears, mouth, and genitals (private parts). Wash face and genitals (private parts) with your normal soap.  6.  Wash thoroughly, paying special attention to the area where your surgery will be performed.  7. Thoroughly rinse your body with warm water.  8. Do not shower/wash with your normal soap after using and rinsing off the CHG soap.  9. Pat yourself dry with a clean towel.  10. Wear clean pajamas to bed the night before surgery.  12. Place clean sheets on your bed the night of your first shower and do not sleep with pets.  13. Shower again with the CHG soap on the day of surgery prior to arriving at the hospital.  14. Do not apply any deodorants/lotions/powders.  15. Please wear clean clothes to the hospital.

## 2023-12-29 MED ORDER — INDOCYANINE GREEN 25 MG IV SOLR
1.2500 mg | INTRAVENOUS | Status: AC
  Administered 2023-12-30: 1.25 mg via INTRAVENOUS

## 2023-12-29 MED ORDER — BUPIVACAINE LIPOSOME 1.3 % IJ SUSP: 10.0000 mL | Freq: Once | INTRAMUSCULAR | Status: DC

## 2023-12-29 MED ORDER — CHLORHEXIDINE GLUCONATE CLOTH 2 % EX PADS
6.0000 | MEDICATED_PAD | Freq: Once | CUTANEOUS | Status: AC
  Administered 2023-12-30: 6 via TOPICAL

## 2023-12-29 MED ORDER — CEFAZOLIN SODIUM-DEXTROSE 2-4 GM/100ML-% IV SOLN
2.0000 g | INTRAVENOUS | Status: AC
  Administered 2023-12-30: 2 g via INTRAVENOUS

## 2023-12-29 MED ORDER — ACETAMINOPHEN 500 MG PO TABS
1000.0000 mg | ORAL_TABLET | ORAL | Status: AC
  Administered 2023-12-30: 1000 mg via ORAL

## 2023-12-29 MED ORDER — GABAPENTIN 300 MG PO CAPS
300.0000 mg | ORAL_CAPSULE | ORAL | Status: AC
  Administered 2023-12-30: 300 mg via ORAL

## 2023-12-29 MED ORDER — CHLORHEXIDINE GLUCONATE 0.12 % MT SOLN
15.0000 mL | Freq: Once | OROMUCOSAL | Status: AC
  Administered 2023-12-30: 15 mL via OROMUCOSAL

## 2023-12-29 MED ORDER — LACTATED RINGERS IV SOLN: INTRAVENOUS | Status: DC

## 2023-12-29 MED ORDER — ORAL CARE MOUTH RINSE: 15.0000 mL | Freq: Once | OROMUCOSAL | Status: AC

## 2023-12-30 ENCOUNTER — Encounter: Payer: Self-pay | Admitting: Surgery

## 2023-12-30 ENCOUNTER — Other Ambulatory Visit: Payer: Self-pay

## 2023-12-30 ENCOUNTER — Ambulatory Visit

## 2023-12-30 ENCOUNTER — Ambulatory Visit
Admission: RE | Admit: 2023-12-30 | Discharge: 2023-12-30 | Disposition: A | Discharge status: Home / Self Care | Attending: Surgery | Admitting: Surgery

## 2023-12-30 ENCOUNTER — Ambulatory Visit: Payer: Self-pay | Admitting: Urgent Care

## 2023-12-30 ENCOUNTER — Encounter
Admission: RE | Disposition: A | Discharge status: Home / Self Care | Payer: Self-pay | Source: Home / Self Care | Attending: Surgery

## 2023-12-30 DIAGNOSIS — I10 Essential (primary) hypertension: Secondary | ICD-10-CM | POA: Diagnosis not present

## 2023-12-30 DIAGNOSIS — F419 Anxiety disorder, unspecified: Secondary | ICD-10-CM | POA: Insufficient documentation

## 2023-12-30 DIAGNOSIS — K8010 Calculus of gallbladder with chronic cholecystitis without obstruction: Secondary | ICD-10-CM | POA: Insufficient documentation

## 2023-12-30 DIAGNOSIS — F319 Bipolar disorder, unspecified: Secondary | ICD-10-CM | POA: Insufficient documentation

## 2023-12-30 DIAGNOSIS — K219 Gastro-esophageal reflux disease without esophagitis: Secondary | ICD-10-CM | POA: Diagnosis not present

## 2023-12-30 DIAGNOSIS — K8020 Calculus of gallbladder without cholecystitis without obstruction: Secondary | ICD-10-CM | POA: Diagnosis present

## 2023-12-30 HISTORY — PX: CHOLECYSTECTOMY: SHX55

## 2023-12-30 SURGERY — CHOLECYSTECTOMY, ROBOT-ASSISTED, LAPAROSCOPIC
Anesthesia: General

## 2023-12-30 MED ORDER — OXYCODONE HCL 5 MG PO TABS: 5.0000 mg | ORAL_TABLET | ORAL | 0 refills | Status: AC | PRN

## 2023-12-30 MED ORDER — CHLORHEXIDINE GLUCONATE 0.12 % MT SOLN
OROMUCOSAL | Status: AC
  Filled 2023-12-30: qty 15

## 2023-12-30 MED ORDER — DEXAMETHASONE SODIUM PHOSPHATE 10 MG/ML IJ SOLN
INTRAMUSCULAR | Status: DC | PRN
  Administered 2023-12-30: 10 mg via INTRAVENOUS

## 2023-12-30 MED ORDER — SUGAMMADEX SODIUM 200 MG/2ML IV SOLN
INTRAVENOUS | Status: DC | PRN
  Administered 2023-12-30: 200 mg via INTRAVENOUS

## 2023-12-30 MED ORDER — PROPOFOL 10 MG/ML IV BOLUS
INTRAVENOUS | Status: DC | PRN
  Administered 2023-12-30: 150 mg via INTRAVENOUS

## 2023-12-30 MED ORDER — 0.9 % SODIUM CHLORIDE (POUR BTL) OPTIME
TOPICAL | Status: DC | PRN
  Administered 2023-12-30: 500 mL

## 2023-12-30 MED ORDER — ACETAMINOPHEN 10 MG/ML IV SOLN: 1000.0000 mg | Freq: Once | INTRAVENOUS | Status: DC | PRN

## 2023-12-30 MED ORDER — FENTANYL CITRATE (PF) 100 MCG/2ML IJ SOLN
INTRAMUSCULAR | Status: DC | PRN
  Administered 2023-12-30 (×2): 50 ug via INTRAVENOUS

## 2023-12-30 MED ORDER — CEFAZOLIN SODIUM-DEXTROSE 2-4 GM/100ML-% IV SOLN
INTRAVENOUS | Status: AC
  Filled 2023-12-30: qty 100

## 2023-12-30 MED ORDER — DROPERIDOL 2.5 MG/ML IJ SOLN: 0.6250 mg | Freq: Once | INTRAMUSCULAR | Status: DC | PRN

## 2023-12-30 MED ORDER — ONDANSETRON HCL 4 MG/2ML IJ SOLN
INTRAMUSCULAR | Status: DC | PRN
  Administered 2023-12-30: 4 mg via INTRAVENOUS

## 2023-12-30 MED ORDER — OXYCODONE HCL 5 MG/5ML PO SOLN: 5.0000 mg | Freq: Once | ORAL | Status: DC | PRN

## 2023-12-30 MED ORDER — MIDAZOLAM HCL 2 MG/2ML IJ SOLN
INTRAMUSCULAR | Status: AC
  Filled 2023-12-30: qty 2

## 2023-12-30 MED ORDER — BUPIVACAINE LIPOSOME 1.3 % IJ SUSP
INTRAMUSCULAR | Status: DC | PRN
  Administered 2023-12-30: 40 mL

## 2023-12-30 MED ORDER — FENTANYL CITRATE (PF) 100 MCG/2ML IJ SOLN: 25.0000 ug | INTRAMUSCULAR | Status: DC | PRN

## 2023-12-30 MED ORDER — ACETAMINOPHEN 500 MG PO TABS: 1000.0000 mg | ORAL_TABLET | Freq: Four times a day (QID) | ORAL | Status: AC | PRN

## 2023-12-30 MED ORDER — BUPIVACAINE LIPOSOME 1.3 % IJ SUSP
INTRAMUSCULAR | Status: AC
  Filled 2023-12-30: qty 10

## 2023-12-30 MED ORDER — LIDOCAINE HCL (CARDIAC) PF 100 MG/5ML IV SOSY
PREFILLED_SYRINGE | INTRAVENOUS | Status: DC | PRN
  Administered 2023-12-30: 100 mg via INTRAVENOUS

## 2023-12-30 MED ORDER — MIDAZOLAM HCL 2 MG/2ML IJ SOLN
INTRAMUSCULAR | Status: DC | PRN
  Administered 2023-12-30: 2 mg via INTRAVENOUS

## 2023-12-30 MED ORDER — PHENYLEPHRINE HCL-NACL 20-0.9 MG/250ML-% IV SOLN
INTRAVENOUS | Status: DC | PRN
Start: 2023-12-30 — End: 2023-12-30
  Administered 2023-12-30: 20 ug/min via INTRAVENOUS

## 2023-12-30 MED ORDER — FENTANYL CITRATE (PF) 100 MCG/2ML IJ SOLN
INTRAMUSCULAR | Status: AC
  Filled 2023-12-30: qty 2

## 2023-12-30 MED ORDER — IBUPROFEN 600 MG PO TABS: 600.0000 mg | ORAL_TABLET | Freq: Three times a day (TID) | ORAL | 1 refills | Status: AC | PRN

## 2023-12-30 MED ORDER — BUPIVACAINE-EPINEPHRINE (PF) 0.25% -1:200000 IJ SOLN
INTRAMUSCULAR | Status: AC
  Filled 2023-12-30: qty 30

## 2023-12-30 MED ORDER — OXYCODONE HCL 5 MG PO TABS: 5.0000 mg | ORAL_TABLET | Freq: Once | ORAL | Status: DC | PRN

## 2023-12-30 MED ORDER — GABAPENTIN 300 MG PO CAPS
ORAL_CAPSULE | ORAL | Status: AC
  Filled 2023-12-30: qty 1

## 2023-12-30 MED ORDER — PHENYLEPHRINE 80 MCG/ML (10ML) SYRINGE FOR IV PUSH (FOR BLOOD PRESSURE SUPPORT)
PREFILLED_SYRINGE | INTRAVENOUS | Status: DC | PRN
  Administered 2023-12-30 (×3): 80 ug via INTRAVENOUS

## 2023-12-30 MED ORDER — KETOROLAC TROMETHAMINE 30 MG/ML IJ SOLN
INTRAMUSCULAR | Status: DC | PRN
  Administered 2023-12-30: 15 mg via INTRAVENOUS

## 2023-12-30 MED ORDER — PROPOFOL 10 MG/ML IV BOLUS
INTRAVENOUS | Status: AC
  Filled 2023-12-30: qty 20

## 2023-12-30 MED ORDER — ACETAMINOPHEN 500 MG PO TABS
ORAL_TABLET | ORAL | Status: AC
  Filled 2023-12-30: qty 2

## 2023-12-30 MED ORDER — ROCURONIUM BROMIDE 100 MG/10ML IV SOLN
INTRAVENOUS | Status: DC | PRN
  Administered 2023-12-30: 50 mg via INTRAVENOUS

## 2023-12-30 SURGICAL SUPPLY — 36 items
BAG PRESSURE INF REUSE 1000 (BAG) IMPLANT
CANNULA CAP OBTURATR AIRSEAL 8 (CAP) IMPLANT
CAUTERY HOOK MNPLR 1.6 DVNC XI (INSTRUMENTS) ×1 IMPLANT
CLIP LIGATING HEMO O LOK GREEN (MISCELLANEOUS) ×1 IMPLANT
DERMABOND ADVANCED .7 DNX12 (GAUZE/BANDAGES/DRESSINGS) ×1 IMPLANT
DRAPE ARM DVNC X/XI (DISPOSABLE) ×4 IMPLANT
DRAPE COLUMN DVNC XI (DISPOSABLE) ×1 IMPLANT
ELECTRODE CAUTERY BLDE TIP 2.5 (TIP) ×1 IMPLANT
ELECTRODE REM PT RTRN 9FT ADLT (ELECTROSURGICAL) ×1 IMPLANT
FORCEPS BPLR R/ABLATION 8 DVNC (INSTRUMENTS) ×1 IMPLANT
FORCEPS PROGRASP DVNC XI (FORCEP) ×1 IMPLANT
GLOVE SURG SYN 7.0 (GLOVE) ×2 IMPLANT
GLOVE SURG SYN 7.0 PF PI (GLOVE) ×2 IMPLANT
GLOVE SURG SYN 7.5 E (GLOVE) ×2 IMPLANT
GLOVE SURG SYN 7.5 PF PI (GLOVE) ×2 IMPLANT
GOWN STRL REUS W/ TWL LRG LVL3 (GOWN DISPOSABLE) ×4 IMPLANT
IRRIGATOR SUCT 8 DISP DVNC XI (IRRIGATION / IRRIGATOR) IMPLANT
IV NS 1000ML BAXH (IV SOLUTION) IMPLANT
KIT PINK PAD W/HEAD ARM REST (MISCELLANEOUS) ×1 IMPLANT
LABEL OR SOLS (LABEL) ×1 IMPLANT
MANIFOLD NEPTUNE II (INSTRUMENTS) ×1 IMPLANT
NDL HYPO 22X1.5 SAFETY MO (MISCELLANEOUS) ×1 IMPLANT
NEEDLE HYPO 22X1.5 SAFETY MO (MISCELLANEOUS) ×1 IMPLANT
NS IRRIG 500ML POUR BTL (IV SOLUTION) ×1 IMPLANT
OBTURATOR OPTICALSTD 8 DVNC (TROCAR) ×1 IMPLANT
PACK LAP CHOLECYSTECTOMY (MISCELLANEOUS) ×1 IMPLANT
SEAL UNIV 5-12 XI (MISCELLANEOUS) ×4 IMPLANT
SET TUBE FILTERED XL AIRSEAL (SET/KITS/TRAYS/PACK) IMPLANT
SET TUBE SMOKE EVAC HIGH FLOW (TUBING) ×1 IMPLANT
SOLUTION ELECTROSURG ANTI STCK (MISCELLANEOUS) ×1 IMPLANT
SPIKE FLUID TRANSFER (MISCELLANEOUS) ×1 IMPLANT
SUT MNCRL AB 4-0 PS2 18 (SUTURE) ×1 IMPLANT
SUT VIC AB 3-0 SH 27X BRD (SUTURE) IMPLANT
SUT VICRYL 0 UR6 27IN ABS (SUTURE) ×2 IMPLANT
SYSTEM BAG RETRIEVAL 10MM (BASKET) ×1 IMPLANT
WATER STERILE IRR 500ML POUR (IV SOLUTION) ×1 IMPLANT

## 2023-12-30 NOTE — Op Note (Signed)
  Procedure Date:  12/30/2023  Pre-operative Diagnosis:  Symptomatic cholelithiasis  Post-operative Diagnosis: Symptomatic cholelithiasis  Procedure:  Robotic assisted cholecystectomy with ICG FireFly cholangiogram  Surgeon:  Aloysius [name], MD  Anesthesia:  General endotracheal  Estimated Blood Loss:  5 ml  Specimens:  gallbladder  Complications:  None  Indications for Procedure:  This is a 58 y.o. female who presents with abdominal pain and workup revealing symptomatic cholelithiasis.  The benefits, complications, treatment options, and expected outcomes were discussed with the patient. The risks of bleeding, infection, recurrence of symptoms, failure to resolve symptoms, bile duct damage, bile duct leak, retained common bile duct stone, bowel injury, and need for further procedures were all discussed with the patient and she was willing to proceed.  Description of Procedure: The patient was correctly identified in the preoperative area and brought into the operating room.  The patient was placed supine with VTE prophylaxis in place.  Appropriate time-outs were performed.  Anesthesia was induced and the patient was intubated.  Appropriate antibiotics were infused.  The abdomen was prepped and draped in a sterile fashion. An infraumbilical incision was made. A cutdown technique was used to enter the abdominal cavity without injury, and a 12 mm robotic port was inserted.  Pneumoperitoneum was obtained with appropriate opening pressures.  Three 8-mm ports were placed in the mid abdomen at the level of the umbilicus under direct visualization.  The DaVinci platform was docked, camera targeted, and instruments were placed under direct visualization.  The gallbladder was identified.  The fundus was grasped and retracted cephalad.  Adhesions were lysed bluntly and with electrocautery. The infundibulum was grasped and retracted laterally, exposing the peritoneum overlying the gallbladder.  This  was incised with electrocautery and extended on either side of the gallbladder.  FireFly cholangiogram was then obtained, and we were able to clearly identify the cystic duct and common bile duct.  The cystic duct and cystic artery were carefully dissected with combination of cautery and blunt dissection.  Both were clipped twice proximally and once distally, cutting in between.  The gallbladder was taken from the gallbladder fossa in a retrograde fashion with electrocautery. The gallbladder was placed in an Endocatch bag. The liver bed was inspected and any bleeding was controlled with electrocautery. The right upper quadrant was then inspected again revealing intact clips, no bleeding, and no ductal injury.   The 8 mm ports were removed under direct visualization and the 12 mm port was removed.  The Endocatch bag was brought out via the umbilical incision. The fascial opening was closed using 0 vicryl suture.  Local anesthetic was infused in all incisions and the incisions were closed with 4-0 Monocryl.  The wounds were cleaned and sealed with DermaBond.  The patient was emerged from anesthesia and extubated and brought to the recovery room for further management.  The patient tolerated the procedure well and all counts were correct at the end of the case.   Aloysius [name], MD

## 2023-12-30 NOTE — Anesthesia Procedure Notes (Signed)
 Procedure Name: Intubation Date/Time: 12/30/2023 12:30 PM  Performed by: Anice Melnick, CRNAPre-anesthesia Checklist: Patient identified, Patient being monitored, Timeout performed, Emergency Drugs available and Suction available Patient Re-evaluated:Patient Re-evaluated prior to induction Oxygen Delivery Method: Circle system utilized Preoxygenation: Pre-oxygenation with 100% oxygen Induction Type: IV induction Ventilation: Mask ventilation without difficulty Laryngoscope Size: 3 and McGrath Grade View: Grade I Tube type: Oral Tube size: 7.0 mm Number of attempts: 1 Airway Equipment and Method: Stylet Placement Confirmation: ETT inserted through vocal cords under direct vision, positive ETCO2 and breath sounds checked- equal and bilateral Secured at: 21 cm Tube secured with: Tape Dental Injury: Teeth and Oropharynx as per pre-operative assessment

## 2023-12-30 NOTE — Interval H&P Note (Signed)
 History and Physical Interval Note:  12/30/2023 12:01 PM  Lorraine Perez  has presented today for surgery, with the diagnosis of Symptomatic cholelithiasis.  The various methods of treatment have been discussed with the patient and family. After consideration of risks, benefits and other options for treatment, the patient has consented to  Procedure(s) with comments: CHOLECYSTECTOMY, ROBOT-ASSISTED, LAPAROSCOPIC (N/A) - ICG as a surgical intervention.  The patient's history has been reviewed, patient examined, no change in status, stable for surgery.  I have reviewed the patient's chart and labs.  Questions were answered to the patient's satisfaction.     Louie Meaders

## 2023-12-30 NOTE — Transfer of Care (Signed)
 Immediate Anesthesia Transfer of Care Note  Patient: Lorraine Perez  Procedure(s) Performed: CHOLECYSTECTOMY, ROBOT-ASSISTED, LAPAROSCOPIC  Patient Location: PACU  Anesthesia Type:General  Level of Consciousness: drowsy and patient cooperative  Airway & Oxygen Therapy: Patient Spontanous Breathing and Patient connected to face mask oxygen  Post-op Assessment: Report given to RN and Post -op Vital signs reviewed and stable  Post vital signs: Reviewed and stable  Last Vitals:  Vitals Value Taken Time  BP 138/82 12/30/23 13:56  Temp    Pulse 77 12/30/23 13:59  Resp 14 12/30/23 13:59  SpO2 100 % 12/30/23 13:59  Vitals shown include unfiled device data.  Last Pain:  Vitals:   12/30/23 1124  TempSrc: Temporal  PainSc: 0-No pain         Complications: No notable events documented.

## 2023-12-30 NOTE — Discharge Instructions (Signed)

## 2023-12-30 NOTE — Anesthesia Preprocedure Evaluation (Addendum)
 Anesthesia Evaluation  Patient identified by MRN, date of birth, ID band Patient awake    Reviewed: Allergy & Precautions, H&P , NPO status , Patient's Chart, lab work & pertinent test results  Airway Mallampati: II  TM Distance: >3 FB Neck ROM: full    Dental no notable dental hx.    Pulmonary neg pulmonary ROS   Pulmonary exam normal        Cardiovascular hypertension, Normal cardiovascular exam     Neuro/Psych  PSYCHIATRIC DISORDERS Anxiety  Bipolar Disorder   negative neurological ROS     GI/Hepatic Neg liver ROS,GERD  ,,  Endo/Other  negative endocrine ROS    Renal/GU      Musculoskeletal   Abdominal   Peds  Hematology negative hematology ROS (+)   Anesthesia Other Findings Past Medical History: No date: Anxiety No date: Bipolar disorder (HCC) No date: Bursitis No date: Cholelithiasis No date: Complication of anesthesia     Comment:  fights when she wakes up No date: Fibroid uterus No date: GERD (gastroesophageal reflux disease) No date: Hyperlipidemia No date: Hypertension  Past Surgical History: No date: ESSURE TUBAL LIGATION     Reproductive/Obstetrics negative OB ROS                             Anesthesia Physical Anesthesia Plan  ASA: 2  Anesthesia Plan: General ETT   Post-op Pain Management: Tylenol PO (pre-op)*, Toradol  IV (intra-op)* and Gabapentin PO (pre-op)*   Induction: Intravenous  PONV Risk Score and Plan: 2 and Ondansetron , Dexamethasone and Midazolam  Airway Management Planned: Oral ETT  Additional Equipment:   Intra-op Plan:   Post-operative Plan: Extubation in OR  Informed Consent: I have reviewed the patients History and Physical, chart, labs and discussed the procedure including the risks, benefits and alternatives for the proposed anesthesia with the patient or authorized representative who has indicated his/her understanding and  acceptance.     Dental Advisory Given  Plan Discussed with: CRNA and Surgeon  Anesthesia Plan Comments:         Anesthesia Quick Evaluation

## 2023-12-31 LAB — SURGICAL PATHOLOGY

## 2023-12-31 NOTE — Anesthesia Postprocedure Evaluation (Signed)
 Anesthesia Post Note  Patient: Lorraine Perez  Procedure(s) Performed: CHOLECYSTECTOMY, ROBOT-ASSISTED, LAPAROSCOPIC  Patient location during evaluation: PACU Anesthesia Type: General Level of consciousness: awake and alert Pain management: pain level controlled Vital Signs Assessment: post-procedure vital signs reviewed and stable Respiratory status: spontaneous breathing, nonlabored ventilation and respiratory function stable Cardiovascular status: blood pressure returned to baseline and stable Postop Assessment: no apparent nausea or vomiting Anesthetic complications: no   No notable events documented.   Last Vitals:  Vitals:   12/30/23 1502 12/30/23 1507  BP: (!) 141/81 (!) 154/78  Pulse: 75 72  Resp: 15   Temp: (!) 36.1 C (!) 36.2 C  SpO2: 97% 99%    Last Pain:  Vitals:   12/30/23 1507  TempSrc: Tympanic  PainSc: 0-No pain                 Camellia Merilee Louder

## 2024-01-14 ENCOUNTER — Encounter: Admitting: Surgery

## 2024-01-14 ENCOUNTER — Encounter: Payer: Self-pay | Admitting: Surgery

## 2024-01-14 ENCOUNTER — Ambulatory Visit (INDEPENDENT_AMBULATORY_CARE_PROVIDER_SITE_OTHER): Admitting: Surgery

## 2024-01-14 VITALS — BP 99/58 | HR 78 | Ht 62.0 in | Wt 144.0 lb

## 2024-01-14 DIAGNOSIS — K8020 Calculus of gallbladder without cholecystitis without obstruction: Secondary | ICD-10-CM

## 2024-01-14 DIAGNOSIS — Z09 Encounter for follow-up examination after completed treatment for conditions other than malignant neoplasm: Secondary | ICD-10-CM

## 2024-01-14 NOTE — Patient Instructions (Addendum)
 The glue will start to come off on its own in 1-2 more weeks.   Follow-up with our office as needed.  Please call and ask to speak with a nurse if you develop questions or concerns.   GENERAL POST-OPERATIVE PATIENT INSTRUCTIONS   WOUND CARE INSTRUCTIONS: Try to keep the wound dry and avoid ointments on the wound unless directed to do so.  If the wound becomes bright red and painful or starts to drain infected material that is not clear, please contact your physician immediately.  If the wound is mildly pink and has a thick firm ridge underneath it, this is normal, and is referred to as a healing ridge.  This will resolve over the next 4-6 weeks.  BATHING: You may shower if you have been informed of this by your surgeon. However, Please do not submerge in a tub, hot tub, or pool until incisions are completely sealed or have been told by your surgeon that you may do so.  DIET:  You may eat any foods that you can tolerate.  It is a good idea to eat a high fiber diet and take in plenty of fluids to prevent constipation.  If you do become constipated you may want to take a mild laxative or take ducolax tablets on a daily basis until your bowel habits are regular.  Constipation can be very uncomfortable, along with straining, after recent surgery.  ACTIVITY: You are encouraged to walk and engage in light activity for the next two weeks.  You should not lift more than 20 pounds for 6 weeks total after surgery as it could put you at increased risk for complications. Twenty pounds is roughly equivalent to a plastic bag of groceries. At that time- Listen to your body when lifting, if you have pain when lifting, stop and then try again in a few days. Soreness after doing exercises or activities of daily living is normal as you get back in to your normal routine.  MEDICATIONS:  Try to take narcotic medications and anti-inflammatory medications, such as tylenol , ibuprofen , naprosyn, etc., with food.  This will  minimize stomach upset from the medication.  Should you develop nausea and vomiting from the pain medication, or develop a rash, please discontinue the medication and contact your physician.  You should not drive, make important decisions, or operate machinery when taking narcotic pain medication.  SUNBLOCK Use sun block to incision area over the next year if this area will be exposed to sun. This helps decrease scarring and will allow you avoid a permanent darkened area over your incision.  QUESTIONS:  Please feel free to call our office if you have any questions, and we will be glad to assist you. [phone]

## 2024-01-14 NOTE — Progress Notes (Signed)
 01/14/2024  HPI: Lorraine Perez is a 58 y.o. female s/p robotic assisted cholecystectomy on 12/30/2023.  Patient presents today for follow-up.  She reports that she has been doing very well and denies any worsening pain, nausea, or vomiting.  No troubles with the incisions.  Vital signs: BP (!) 99/58   Pulse 78   Ht 5' 2 (1.575 m)   Wt 144 lb (65.3 kg)   SpO2 98%   BMI 26.34 kg/m    Physical Exam: Constitutional: No acute distress Abdomen: Soft, nondistended, appropriately sore to palpation.  Incisions are healing well and are clean, dry, intact with Dermabond starting to peel off.  Assessment/Plan: This is a 58 y.o. female s/p robotic assisted cholecystectomy.  - Patient is healing appropriately and has not had any complications with her diet.  Incisions are healing well there is no evidence of infection. - Reviewed with the patient again activity restrictions. - Reviewed with the patient pathology results showing mild chronic cholecystitis with cholelithiasis. - Follow-up as needed.   Aloysius [name], MD [facility]

## 2024-03-14 IMAGING — CR DG CHEST 2V
2 series · 2 of 2 positions shown · non-contrast
Comparison: None Available.

CLINICAL DATA: Chest pain

EXAM:
CHEST - 2 VIEW

[chest pa]
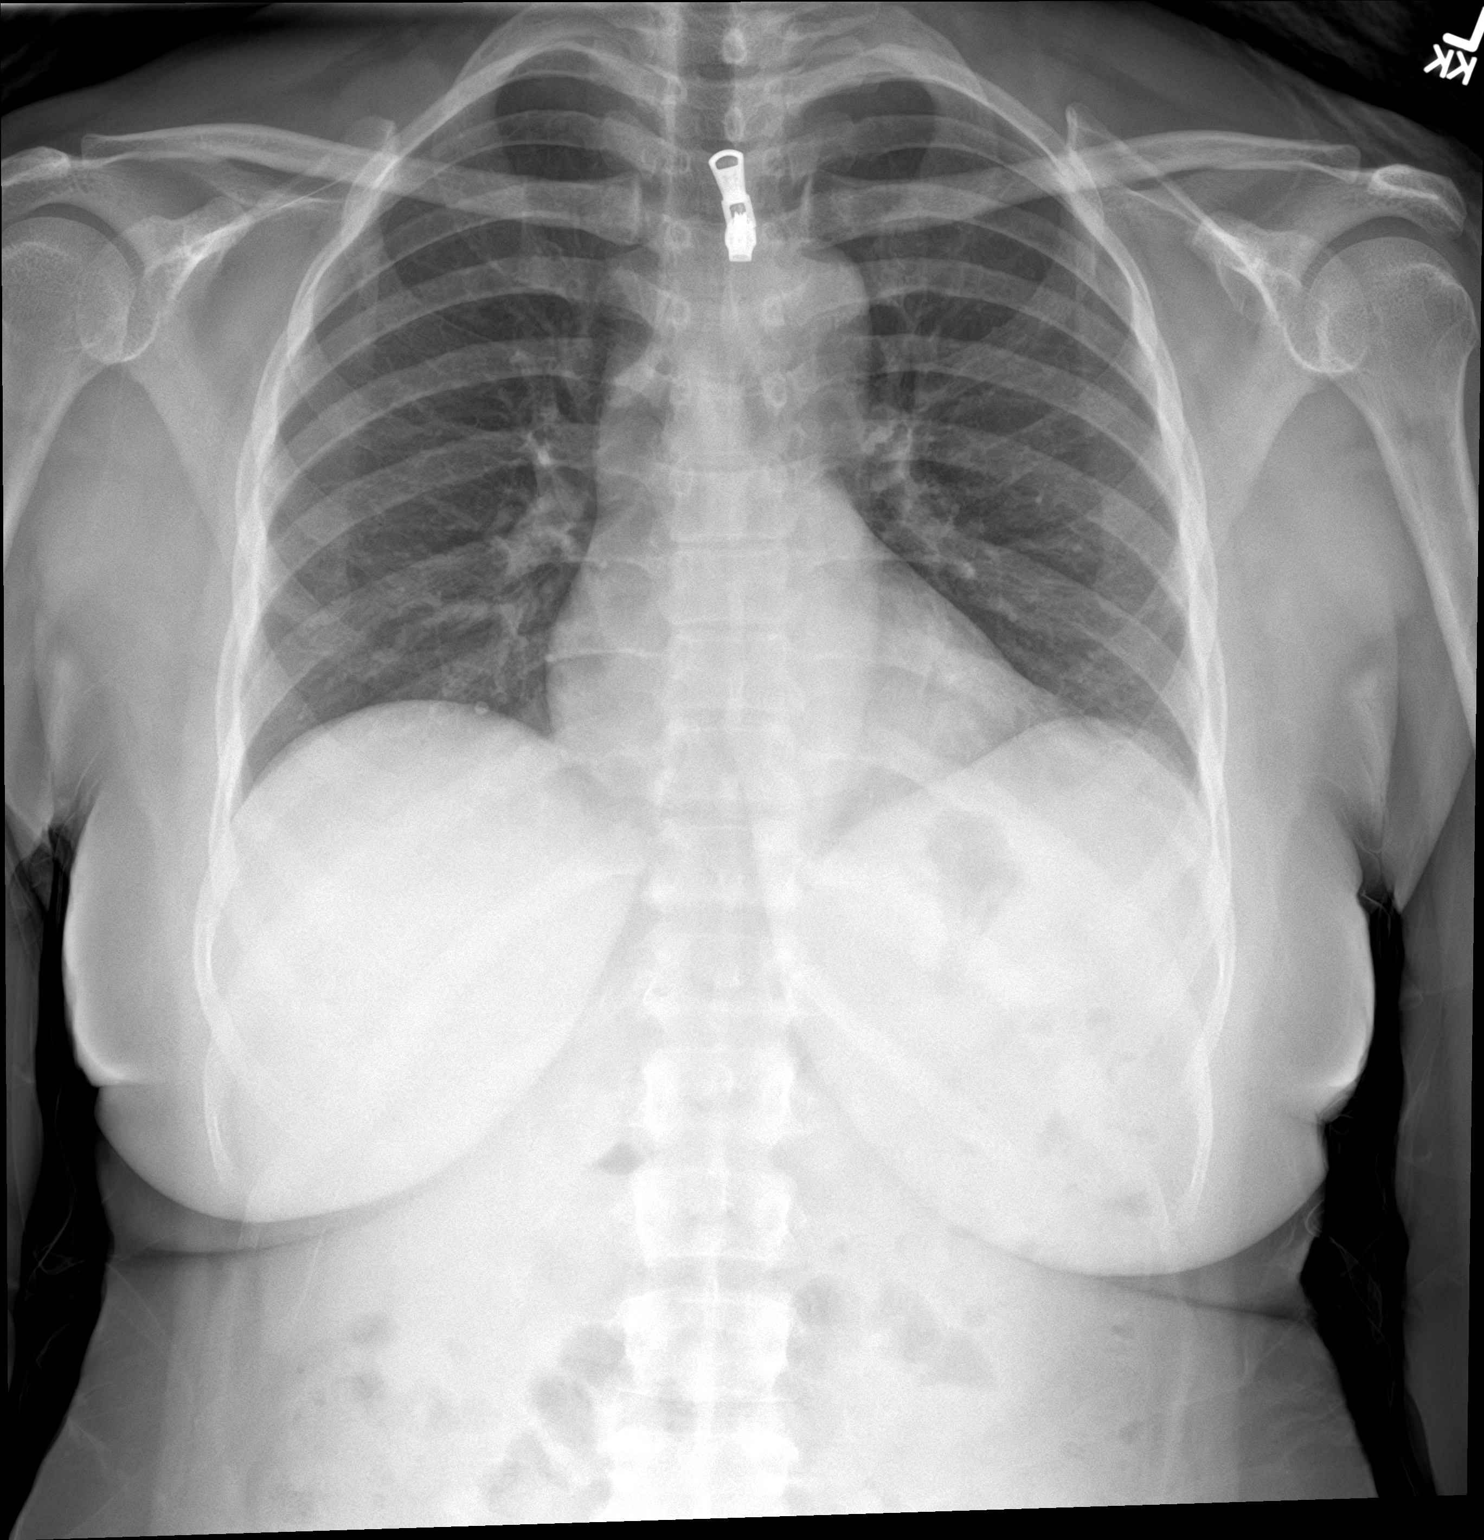

[chest lat]
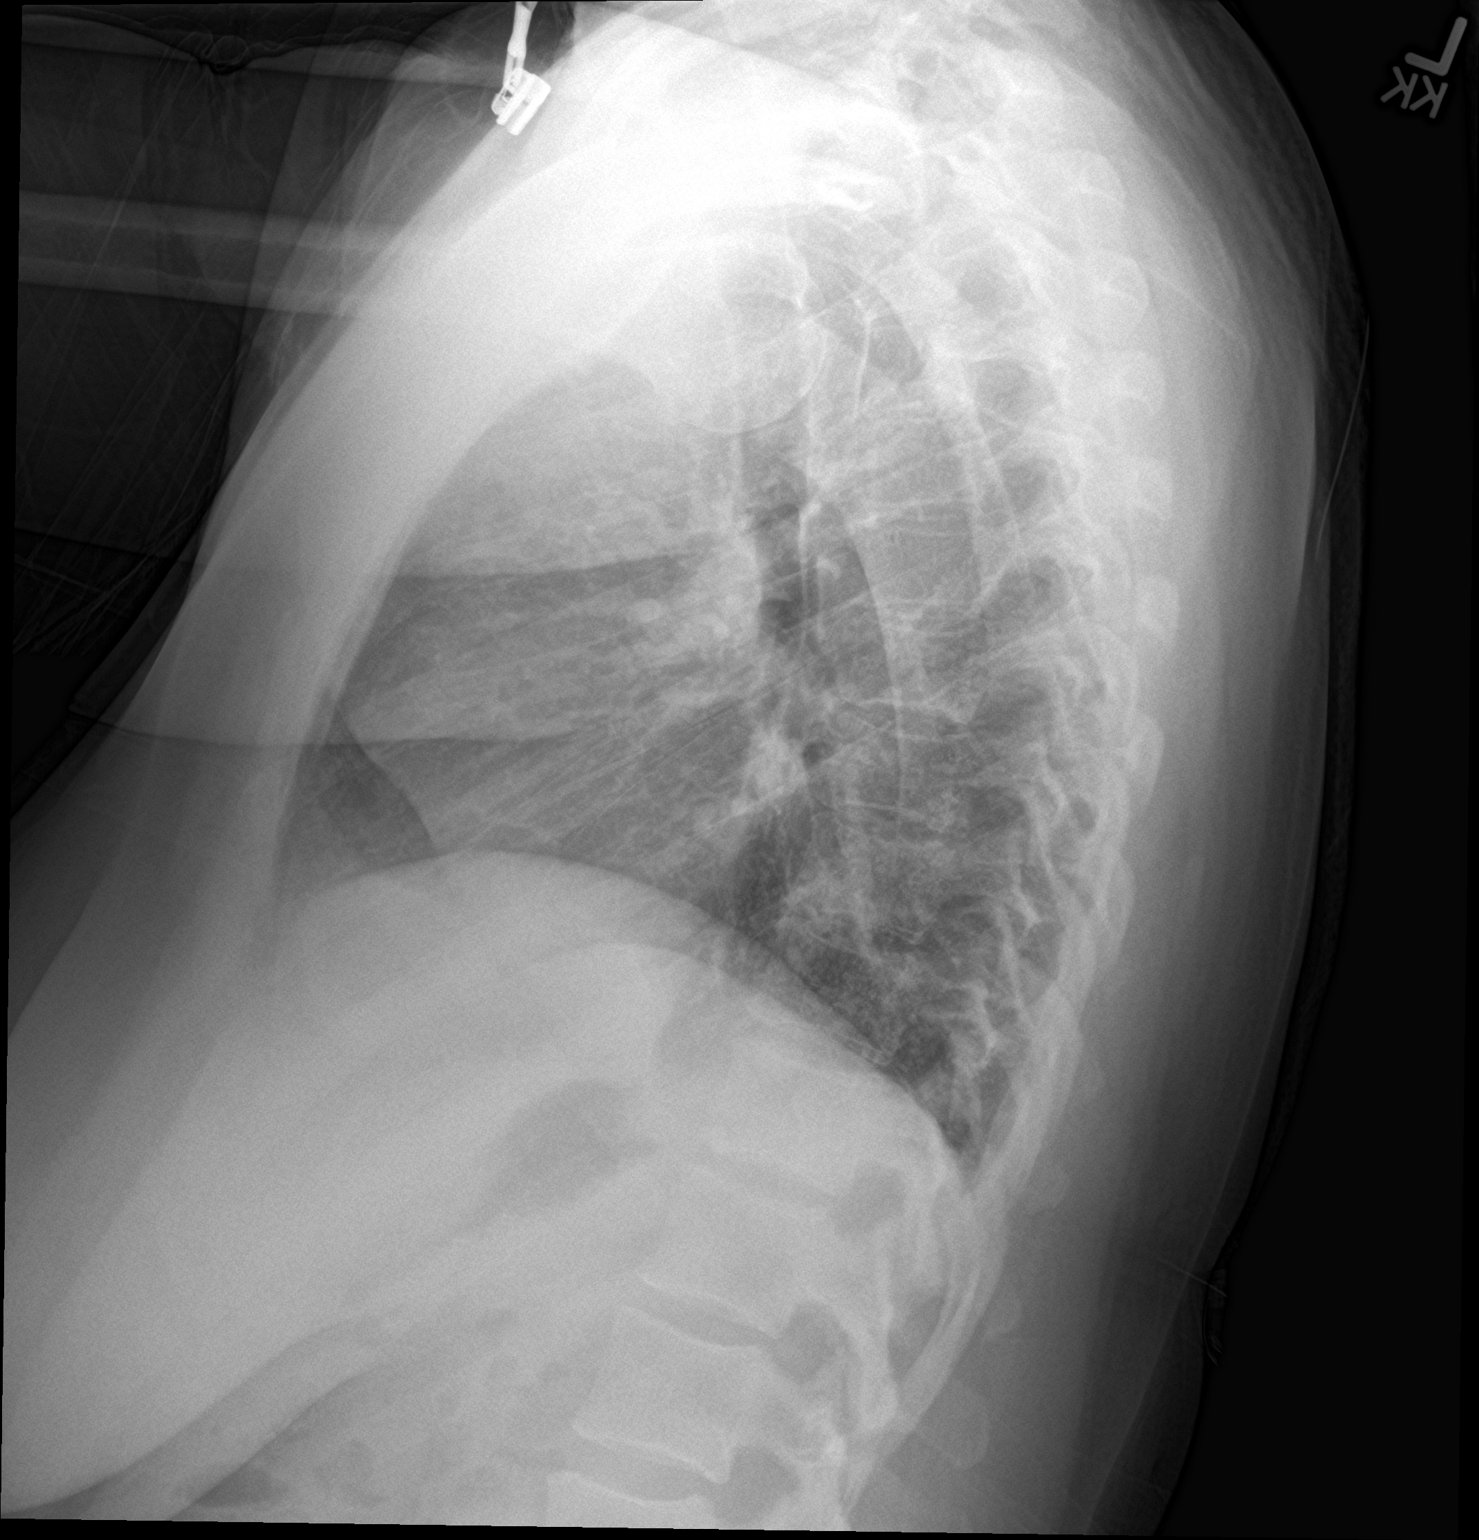

[2 of 2 positions shown; findings below may reference images not displayed]

FINDINGS: The heart size and mediastinal contours are within normal limits.
Both lungs are clear. The visualized skeletal structures are
unremarkable.
IMPRESSION: No active cardiopulmonary disease.
